# Patient Record
Sex: Male | Born: 1990 | Race: White | Hispanic: No | Marital: Single
Health system: Midwestern US, Community
[De-identification: ages and names within clinical notes are randomized; demographics above are authoritative.]

---
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
## 2014-10-22 ENCOUNTER — Emergency Department (HOSPITAL_COMMUNITY): Payer: Self-pay

## 2014-10-22 ENCOUNTER — Encounter (HOSPITAL_COMMUNITY): Payer: Self-pay | Admitting: Emergency Medicine

## 2014-10-22 ENCOUNTER — Emergency Department (HOSPITAL_COMMUNITY)
Admission: EM | Admit: 2014-10-22 | Discharge: 2014-10-22 | Disposition: A | Discharge status: Home / Self Care | Payer: Self-pay | Attending: Emergency Medicine | Admitting: Emergency Medicine

## 2014-10-22 DIAGNOSIS — Y9289 Other specified places as the place of occurrence of the external cause: Secondary | ICD-10-CM | POA: Insufficient documentation

## 2014-10-22 DIAGNOSIS — L089 Local infection of the skin and subcutaneous tissue, unspecified: Secondary | ICD-10-CM

## 2014-10-22 DIAGNOSIS — L988 Other specified disorders of the skin and subcutaneous tissue: Secondary | ICD-10-CM | POA: Insufficient documentation

## 2014-10-22 DIAGNOSIS — Y9389 Activity, other specified: Secondary | ICD-10-CM | POA: Insufficient documentation

## 2014-10-22 DIAGNOSIS — Z720 Tobacco use: Secondary | ICD-10-CM | POA: Insufficient documentation

## 2014-10-22 DIAGNOSIS — Y998 Other external cause status: Secondary | ICD-10-CM | POA: Insufficient documentation

## 2014-10-22 DIAGNOSIS — X58XXXA Exposure to other specified factors, initial encounter: Secondary | ICD-10-CM | POA: Insufficient documentation

## 2014-10-22 DIAGNOSIS — S6992XA Unspecified injury of left wrist, hand and finger(s), initial encounter: Secondary | ICD-10-CM | POA: Insufficient documentation

## 2014-10-22 MED ORDER — CEPHALEXIN 500 MG PO CAPS: 500.0000 mg | ORAL_CAPSULE | Freq: Three times a day (TID) | ORAL | Status: AC

## 2014-10-22 MED ORDER — LIDOCAINE HCL (PF) 1 % IJ SOLN
10.0000 mL | Freq: Once | INTRAMUSCULAR | Status: AC
  Administered 2014-10-22: 10 mL via INTRADERMAL

## 2014-10-22 MED ORDER — HYDROCODONE-ACETAMINOPHEN 5-325 MG PO TABS: 1.0000 | ORAL_TABLET | Freq: Four times a day (QID) | ORAL | Status: AC | PRN

## 2014-10-22 NOTE — ED Provider Notes (Signed)
CSN: [id number]     Arrival date & time 10/22/14  1208 History  This chart was scribed for Arthor CaptainAbigail Sallie Staron, PA-C working with No att. providers found by Elveria Risingimelie Horne, ED Scribe. This patient was seen in room WTR8/WTR8 and the patient's care was started at 2:33 PM.   Chief Complaint  Patient presents with  . Finger Injury   HPI HPI Comments: Jerry Harvey is a 24 y.o. male who presents to the Emergency Department complaining of worsening left second finger swelling, onset five days ago. Patient is uncertain of causation. Patient reports that two ago he developed swelling and pressure located at the distal tip of the finger, at hyponychium. Patient is unsure of foreign body.  Patient denies illicit drug use.    History reviewed. No pertinent past medical history. History reviewed. No pertinent past surgical history. No family history on file. History  Substance Use Topics  . Smoking status: Current Every Day Smoker -- 1.00 packs/day    Types: Cigarettes  . Smokeless tobacco: Not on file  . Alcohol Use: Yes    Review of Systems  Constitutional: Negative for fever and chills.  Skin: Positive for color change.       Nail infection      Allergies  Review of patient's allergies indicates no known allergies.  Home Medications   Prior to Admission medications   Medication Sig Start Date End Date Taking? Authorizing Provider  acetaminophen (TYLENOL) 325 MG tablet Take 650 mg by mouth every 6 (six) hours as needed for headache.   Yes Historical Provider, MD  cephALEXin (KEFLEX) 500 MG capsule Take 1 capsule (500 mg total) by mouth 3 (three) times daily. 10/22/14   Arthor CaptainAbigail Junie Engram, PA-C  HYDROcodone-acetaminophen (NORCO) 5-325 MG per tablet Take 1-2 tablets by mouth every 6 (six) hours as needed for moderate pain. 10/22/14   Arthor CaptainAbigail Wanisha Shiroma, PA-C   Triage Vitals: BP 158/108 mmHg  Pulse 80  Temp(Src) 98.2 F (36.8 C) (Oral)  Resp 18  SpO2 99% Physical Exam  Constitutional: He is  oriented to person, place, and time. He appears well-developed and well-nourished. No distress.  HENT:  Head: Normocephalic and atraumatic.  Eyes: EOM are normal.  Neck: Neck supple. No tracheal deviation present.  Cardiovascular: Normal rate.   Pulmonary/Chest: Effort normal. No respiratory distress.  Musculoskeletal: Normal range of motion.  Neurological: He is alert and oriented to person, place, and time.  Skin: Skin is warm and dry.  Left index finger: distal finger tip infection   Psychiatric: He has a normal mood and affect. His behavior is normal.  Nursing note and vitals reviewed.   ED Course  INCISION AND DRAINAGE Date/Time: 10/26/2014 1:03 PM Performed by: Arthor CaptainHARRIS, Garnette Greb Authorized by: Arthor CaptainHARRIS, Briannie Gutierrez Consent: Verbal consent obtained. Risks and benefits: risks, benefits and alternatives were discussed Type: abscess Body area: upper extremity Location details: left index finger Anesthesia: digital block Local anesthetic: lidocaine 1% without epinephrine Anesthetic total: 8 ml Scalpel size: 11 Needle gauge: 25. Incision type: elliptical Complexity: simple Drainage: purulent Drainage amount: moderate Wound treatment: wound left open Packing material: none    Site anesthetized, incision made over site, wound drained and explored loculations, rinsed with copious amounts of normal saline, wound packed with sterile gauze, covered with dry, sterile dressing.  Pt tolerated procedure well without complications.  Instructions for care discussed verbally and pt provided with additional written instructions for homecare and f/u.   COORDINATION OF CARE: 2:33 PM- Plans to incise and drain. Discussed treatment plan  with patient at bedside and patient agreed to plan.   Labs Review Labs Reviewed - No data to display  Imaging Review No results found.   EKG Interpretation None      MDM   Final diagnoses:  Finger infection    patient with finger infectionof  the l INDEX EMINATING from the hyponichium. Appears to be from a small splinter which was removed. He will be discharged with abx and hand follow up.   I personally performed the services described in this documentation, which was scribed in my presence. The recorded information has been reviewed and is accurate.      Arthor Captain, PA-C 10/26/14 1309  Derwood Kaplan, MD 10/28/14 1209

## 2014-10-22 NOTE — ED Notes (Addendum)
Pt states on Sunday left index finger began to swell and now he cannot bend finger, denies injury. Pt states the tip of finger is numb now.

## 2014-10-22 NOTE — Discharge Instructions (Signed)
Fingertip Infection °When an infection is around the nail, it is called a paronychia. When it appears over the tip of the finger, it is called a felon. These infections are due to minor injuries or cracks in the skin. If they are not treated properly, they can lead to bone infection and permanent damage to the fingernail. °Incision and drainage is necessary if a pus pocket (an abscess) has formed. Antibiotics and pain medicine may also be needed. Keep your hand elevated for the next 2-3 days to reduce swelling and pain. If a pack was placed in the abscess, it should be removed in 1-2 days by your caregiver. Soak the finger in warm water for 20 minutes 4 times daily to help promote drainage. °Keep the hands as dry as possible. Wear protective gloves with cotton liners. See your caregiver for follow-up care as recommended.  °HOME CARE INSTRUCTIONS  °· Keep wound clean, dry and dressed as suggested by your caregiver. °· Soak in warm salt water for fifteen minutes, four times per day for bacterial infections. °· Your caregiver will prescribe an antibiotic if a bacterial infection is suspected. Take antibiotics as directed and finish the prescription, even if the problem appears to be improving before the medicine is gone. °· Only take over-the-counter or prescription medicines for pain, discomfort, or fever as directed by your caregiver. °SEEK IMMEDIATE MEDICAL CARE IF: °· There is redness, swelling, or increasing pain in the wound. °· Pus or any other unusual drainage is coming from the wound. °· An unexplained oral temperature above 102° F (38.9° C) develops. °· You notice a foul smell coming from the wound or dressing. °MAKE SURE YOU:  °· Understand these instructions. °· Monitor your condition. °· Contact your caregiver if you are getting worse or not improving. °Document Released: 09/06/2004 Document Revised: 10/22/2011 Document Reviewed: 09/02/2008 °Exit[employer]® Patient Information ©2015 [employer]. This  information is not intended to replace advice given to you by your health care provider. Make sure you discuss any questions you have with your health care provider. ° °

## 2019-11-05 ENCOUNTER — Ambulatory Visit: Payer: Self-pay

## 2019-11-05 ENCOUNTER — Ambulatory Visit: Payer: Self-pay | Attending: Internal Medicine

## 2019-11-05 DIAGNOSIS — Z23 Encounter for immunization: Secondary | ICD-10-CM

## 2019-11-05 NOTE — Progress Notes (Signed)
   Covid-19 Vaccination Clinic  Name:  Jerry Harvey    MRN: [id number] DOB: Feb 23, 1991  11/05/2019  Mr. Jerry Harvey was observed post Covid-19 immunization for 15 minutes without incident. He was provided with Vaccine Information Sheet and instruction to access the V-Safe system.   Mr. Jerry Harvey was instructed to call 911 with any severe reactions post vaccine: Marland Kitchen Difficulty breathing  . Swelling of face and throat  . A fast heartbeat  . A bad rash all over body  . Dizziness and weakness   Immunizations Administered    Name Date Dose VIS Date Route   Moderna COVID-19 Vaccine 11/05/2019 11:37 AM 0.5 mL 07/14/2019 Intramuscular   Manufacturer: Moderna   Lot: [id number]   NDC: 24235-361-44

## 2019-12-03 ENCOUNTER — Ambulatory Visit: Payer: Self-pay | Attending: Internal Medicine

## 2019-12-03 DIAGNOSIS — Z23 Encounter for immunization: Secondary | ICD-10-CM

## 2019-12-03 NOTE — Progress Notes (Signed)
   Covid-19 Vaccination Clinic  Name:  Dhilan Brauer    MRN: [id number] DOB: 1990-12-30  12/03/2019  Mr. Mcpheeters was observed post Covid-19 immunization for 15 minutes without incident. He was provided with Vaccine Information Sheet and instruction to access the V-Safe system.   Mr. Creswell was instructed to call 911 with any severe reactions post vaccine: Marland Kitchen Difficulty breathing  . Swelling of face and throat  . A fast heartbeat  . A bad rash all over body  . Dizziness and weakness   Immunizations Administered    Name Date Dose VIS Date Route   Moderna COVID-19 Vaccine 12/03/2019 11:43 AM 0.5 mL 07/2019 Intramuscular   Manufacturer: Gala Murdoch   Lot: [id number]   NDC: 55217-471-59

## 2021-10-16 ENCOUNTER — Inpatient Hospital Stay: Admit: 2021-10-16 | Discharge: 2021-10-16 | Disposition: A | Discharge status: Home / Self Care

## 2021-10-16 DIAGNOSIS — H1031 Unspecified acute conjunctivitis, right eye: Secondary | ICD-10-CM

## 2021-10-16 MED ORDER — POLYMYXIN B-TRIMETHOPRIM 10000-0.1 UNIT/ML-% OP SOLN
OPHTHALMIC | 0 refills | Status: AC
Start: 2021-10-16 — End: 2021-10-23

## 2021-10-16 NOTE — ED Provider Notes (Signed)
[facility]  Urgent Care Encounter       CHIEF COMPLAINT       Chief Complaint   Patient presents with    Conjunctivitis       Nurses Notes reviewed and I agree except as noted in the HPI.  HISTORY OF PRESENT ILLNESS   Shawn Braun is a 31 y.o. male who presents for evaluation of right eye redness and drainage.  Patient states that the symptoms began yesterday but significantly worsened this morning when he woke up as there was a large amount of green discharge in his eye and his eye was significantly more red.  He denies any visual disturbances or any other symptoms at this time.  He does wear glasses but states that he does not wear contacts.    The history is provided by the patient.     REVIEW OF SYSTEMS     Review of Systems   Constitutional:  Negative for chills and fever.   HENT:  Negative for congestion and sore throat.    Eyes:  Positive for discharge and redness. Negative for visual disturbance.   Respiratory:  Negative for cough and shortness of breath.    Cardiovascular:  Negative for chest pain.   Gastrointestinal:  Negative for nausea and vomiting.   Genitourinary:  Negative for difficulty urinating.   Musculoskeletal:  Negative for arthralgias and myalgias.   Skin:  Negative for rash.   Allergic/Immunologic: Negative for immunocompromised state.   Neurological:  Negative for headaches.     PAST MEDICAL HISTORY   History reviewed. No pertinent past medical history.    SURGICALHISTORY     Patient  has no past surgical history on file.    CURRENT MEDICATIONS       Previous Medications    No medications on file       ALLERGIES     Patient is has No Known Allergies.    Patients   There is no immunization history on file for this patient.    FAMILY HISTORY     Patient's family history is not on file.    SOCIAL HISTORY     Patient      PHYSICAL EXAM     ED TRIAGE VITALS  BP: 138/87, Temp: 97.8 ??F (36.6 ??C), Heart Rate: 76, Resp: 16, SpO2: 97 %,Estimated body mass index is 29.21  kg/m?? as calculated from the following:    Height as of this encounter: 6\' 4"  (1.93 m).    Weight as of this encounter: 240 lb (108.9 kg).,No LMP for male patient.    Physical Exam  Vitals and nursing note reviewed.   Eyes:      General:         Right eye: No discharge.         Left eye: No discharge.      Extraocular Movements: Extraocular movements intact.      Conjunctiva/sclera:      Right eye: Right conjunctiva is injected. No exudate.     Left eye: Left conjunctiva is not injected. No exudate.     Pupils: Pupils are equal, round, and reactive to light.       DIAGNOSTIC RESULTS     Labs:No results found for this visit on 10/16/21.    IMAGING:    No orders to display         EKG:      URGENT CARE COURSE:     Vitals:  10/16/21 1001   BP: 138/87   Pulse: 76   Resp: 16   Temp: 97.8 ??F (36.6 ??C)   SpO2: 97%   Weight: 240 lb (108.9 kg)   Height: 6\' 4"  (1.93 m)       Medications - No data to display         PROCEDURES:  None    FINAL IMPRESSION      1. Acute bacterial conjunctivitis of right eye          DISPOSITION/ PLAN       Physical exam is consistent with conjunctivitis of the right eye.  I discussed with the patient the plan to treat with topical antibiotic drops and follow-up on an outpatient basis if symptoms do not improve or worsen.  He is agreeable to the plan as discussed.    PATIENT REFERRED TO:  No primary care provider on file.  No primary physician on file.      DISCHARGE MEDICATIONS:  New Prescriptions    TRIMETHOPRIM-POLYMYXIN B (POLYTRIM) 10000-0.1 UNIT/ML-% OPHTHALMIC SOLUTION    Place 1 drop into the right eye every 4 hours for 7 days       Discontinued Medications    No medications on file       Current Discharge Medication List          , APRN - CNP    (Please note that portions of this note were completed with a voice recognition program. Efforts were made to edit the dictations but occasionally words are mis-transcribed.)           Cora Collum, APRN - CNP  10/16/21  1018

## 2021-11-10 ENCOUNTER — Inpatient Hospital Stay: Admit: 2021-11-10 | Discharge: 2021-11-10 | Disposition: A | Discharge status: Home / Self Care

## 2021-11-10 DIAGNOSIS — S61310A Laceration without foreign body of right index finger with damage to nail, initial encounter: Secondary | ICD-10-CM

## 2021-11-10 DIAGNOSIS — S61210A Laceration without foreign body of right index finger without damage to nail, initial encounter: Secondary | ICD-10-CM

## 2021-11-10 MED ORDER — BACITRACIN ZINC 500 UNIT/GM EX OINT
500 UNIT/GM | Freq: Once | CUTANEOUS | Status: AC
Start: 2021-11-10 — End: 2021-11-10
  Administered 2021-11-10: 13:00:00 1 via TOPICAL

## 2021-11-10 MED ORDER — IBUPROFEN 800 MG PO TABS
800 MG | ORAL_TABLET | Freq: Three times a day (TID) | ORAL | 0 refills | Status: AC
Start: 2021-11-10 — End: ?

## 2021-11-10 MED ORDER — CEPHALEXIN 500 MG PO CAPS
500 MG | ORAL_CAPSULE | Freq: Three times a day (TID) | ORAL | 0 refills | Status: AC
Start: 2021-11-10 — End: 2021-11-17

## 2021-11-10 MED ORDER — TETANUS-DIPHTH-ACELL PERTUSSIS 5-2.5-18.5 LF-MCG/0.5 IM SUSP
Freq: Once | INTRAMUSCULAR | Status: AC
Start: 2021-11-10 — End: 2021-11-10
  Administered 2021-11-10: 13:00:00 0.5 mL via INTRAMUSCULAR

## 2021-11-10 MED FILL — BACITRACIN ZINC 500 UNIT/GM EX OINT: 500 UNIT/GM | CUTANEOUS | Qty: 1

## 2021-11-10 MED FILL — BOOSTRIX 5-2.5-18.5 LF-MCG/0.5 IM SUSP: INTRAMUSCULAR | Qty: 0.5

## 2021-11-10 NOTE — ED Provider Notes (Signed)
[facility]  Urgent Care Encounter       CHIEF COMPLAINT       Chief Complaint   Patient presents with    Finger Laceration     Right index finger        Nurses Notes reviewed and I agree except as noted in the HPI.  HISTORY OF PRESENT ILLNESS   Shawn Braun is a 31 y.o. male who presents for evaluation of a laceration to the right index finger.  Patient states that he was working on laying some new carpet in his home last night around 9:30 PM when the knife he was using slipped and cut across his index finger.  He states that there is pain but denies any numbness or tingling.  He applied some bandages at home but denies any other medications or interventions.  He states that his last tetanus shot has been well over 10 years ago.    The history is provided by the patient.     REVIEW OF SYSTEMS     Review of Systems   Constitutional:  Negative for chills and fever.   Respiratory:  Negative for shortness of breath.    Cardiovascular:  Negative for chest pain.   Gastrointestinal:  Negative for nausea and vomiting.   Musculoskeletal:  Positive for arthralgias.   Skin:  Positive for wound. Negative for rash.   Neurological:  Negative for weakness and numbness.     PAST MEDICAL HISTORY   History reviewed. No pertinent past medical history.    SURGICALHISTORY     Patient  has no past surgical history on file.    CURRENT MEDICATIONS       Previous Medications    No medications on file       ALLERGIES     Patient is has No Known Allergies.    Patients   Immunization History   Administered Date(s) Administered    TDaP, ADACEL (age 64y-64y), Leda Min (age 10y+), IM, 0.73mL 11/10/2021       FAMILY HISTORY     Patient's family history is not on file.    SOCIAL HISTORY     Patient  reports that he has been smoking e-cigarettes. He has never used smokeless tobacco. He reports that he does not currently use alcohol. He reports that he does not use drugs.    PHYSICAL EXAM     ED TRIAGE VITALS  BP:  129/80, Temp: 98.1 F (36.7 C), Heart Rate: 76, Resp: 16, SpO2: 99 %,Estimated body mass index is 28 kg/m as calculated from the following:    Height as of this encounter: 6\' 4"  (1.93 m).    Weight as of this encounter: 230 lb (104.3 kg).,No LMP for male patient.    Physical Exam  Vitals and nursing note reviewed.   Constitutional:       General: He is not in acute distress.     Appearance: He is well-developed. He is not diaphoretic.   Eyes:      Conjunctiva/sclera:      Right eye: Right conjunctiva is not injected.      Left eye: Left conjunctiva is not injected.      Pupils: Pupils are equal.   Cardiovascular:      Rate and Rhythm: Normal rate and regular rhythm.      Heart sounds: No murmur heard.  Pulmonary:      Effort: Pulmonary effort is normal. No respiratory distress.  Breath sounds: Normal breath sounds.   Musculoskeletal:      Right hand: Laceration and tenderness present. No swelling. Normal range of motion. Normal strength. Normal sensation. Normal capillary refill.        Hands:       Cervical back: Normal range of motion.      Comments: 1 cm laceration to the distal surface of the right index finger extending through the nail   Skin:     General: Skin is warm.      Findings: No rash.   Neurological:      Mental Status: He is alert and oriented to person, place, and time.   Psychiatric:         Behavior: Behavior normal.       DIAGNOSTIC RESULTS     Labs:No results found for this visit on 11/10/21.    IMAGING:    No orders to display         EKG:      URGENT CARE COURSE:     Vitals:    11/10/21 0823   BP: 129/80   Pulse: 76   Resp: 16   Temp: 98.1 F (36.7 C)   TempSrc: Temporal   SpO2: 99%   Weight: 230 lb (104.3 kg)   Height: 6\' 4"  (1.93 m)       Medications   Tetanus-Diphth-Acell Pertussis (BOOSTRIX) injection 0.5 mL (0.5 mLs IntraMUSCular Given 11/10/21 0842)   bacitracin zinc ointment 1 Package (1 Package Topical Given 11/10/21 0844)            PROCEDURES:  None    FINAL IMPRESSION      1.  Laceration of right index finger without foreign body with damage to nail, initial encounter          DISPOSITION/ PLAN     I discussed with the patient that based on the location of the laceration I do not believe this is a good candidate for suturing and we will apply topical antibiotic ointment and apply a dressing as well as oral antibiotics to prevent infection.  Patient is advised to keep the wound clean and dry as much as possible and to follow-up if symptoms would progress.  He is agreeable to plan as discussed.      PATIENT REFERRED TO:  No primary care provider on file.  No primary physician on file.      DISCHARGE MEDICATIONS:  New Prescriptions    CEPHALEXIN (KEFLEX) 500 MG CAPSULE    Take 1 capsule by mouth 3 times daily for 7 days    IBUPROFEN (ADVIL;MOTRIN) 800 MG TABLET    Take 1 tablet by mouth 3 times daily (with meals)       Discontinued Medications    No medications on file       Current Discharge Medication List          11/12/21, APRN - CNP    (Please note that portions of this note were completed with a voice recognition program. Efforts were made to edit the dictations but occasionally words are mis-transcribed.)          Cora Collum, APRN - CNP  11/10/21 [phone]

## 2021-11-10 NOTE — ED Notes (Signed)
Right index finger cleaned with antimicrobial wound cleanser.  Dried.  Bacitracin applied, nonstick gauze, wrapped with coban.  Covered the right index finger with an aluminium finger splint.  Secured with coban.  Tolerated well.  Tetanus updated.       Arline Asp, RN  11/10/21 [phone]

## 2021-11-10 NOTE — ED Triage Notes (Signed)
Arrives to [facility] for the evaluation of right index finger laceration that occurred last night around 2130.  Cut the finger with a box knife while replacing carpet at his house.  After cutting the finger patient washed the site with peroxide and alcohol.  Is covered with at bandaid at this time.  Patient rates pain 9/10 in severity.  Did take Tylenol this morning but not helping.  Last Tetanus was in 2007.  Afebrile.  Waiting provider to assess.

## 2022-09-22 ENCOUNTER — Inpatient Hospital Stay
Admit: 2022-09-22 | Discharge: 2022-09-22 | Disposition: A | Discharge status: Home / Self Care | Payer: PRIVATE HEALTH INSURANCE

## 2022-09-22 DIAGNOSIS — R059 Cough, unspecified: Secondary | ICD-10-CM

## 2022-09-22 DIAGNOSIS — R058 Other specified cough: Secondary | ICD-10-CM

## 2022-09-22 MED ORDER — PREDNISONE 20 MG PO TABS
20 | ORAL_TABLET | Freq: Two times a day (BID) | ORAL | 0 refills | Status: AC
Start: 2022-09-22 — End: 2022-09-27

## 2022-09-22 MED ORDER — PROMETHAZINE-DM 6.25-15 MG/5ML PO SYRP
Freq: Four times a day (QID) | ORAL | 0 refills | Status: AC | PRN
Start: 2022-09-22 — End: 2022-09-29

## 2022-09-22 MED ORDER — BENZONATATE 200 MG PO CAPS
200 | ORAL_CAPSULE | Freq: Three times a day (TID) | ORAL | 0 refills | Status: AC | PRN
Start: 2022-09-22 — End: 2022-09-29

## 2022-09-22 NOTE — ED Triage Notes (Signed)
Pt to UC with c/o cough ongoing x 1 week

## 2022-09-22 NOTE — ED Provider Notes (Signed)
[facility]  Urgent Care Encounter      CHIEF COMPLAINT       Chief Complaint   Patient presents with    Cough       Nurses Notes reviewed and I agree except as noted in the HPI.  HISTORY OF PRESENT ILLNESS   Shawn Braun is a 32 y.o. male who presents to urgent care with complaints of cough.  Patient reports approximately 2 weeks ago he had a fever with congestion and cough.  Patient reports that resolved as of the cough has not.  Patient denies shortness of breath, chest pain, abdominal pain.  Patient reports he has been using over-the-counter remedies without relief.  Patient denies being around anyone sick recently that he is aware of.    REVIEW OF SYSTEMS     Review of Systems   Constitutional:  Negative for fatigue and fever.   HENT:  Negative for congestion, postnasal drip, rhinorrhea, sinus pain and sore throat.    Respiratory:  Positive for cough. Negative for shortness of breath and wheezing.    Cardiovascular:  Negative for chest pain.   Gastrointestinal:  Negative for abdominal pain, diarrhea, nausea and vomiting.   Musculoskeletal:  Negative for arthralgias.   Neurological:  Negative for dizziness, seizures, numbness and headaches.       PAST MEDICAL HISTORY   History reviewed. No pertinent past medical history.    SURGICAL HISTORY     Patient  has no past surgical history on file.    CURRENT MEDICATIONS       Discharge Medication List as of 09/22/2022  8:46 AM        CONTINUE these medications which have NOT CHANGED    Details   ibuprofen (ADVIL;MOTRIN) 800 MG tablet Take 1 tablet by mouth 3 times daily (with meals), Disp-90 tablet, R-0Normal             ALLERGIES     Patient is has No Known Allergies.    FAMILY HISTORY     Patient'sfamily history is not on file.    SOCIAL HISTORY     Patient  reports that he has been smoking e-cigarettes. He has never used smokeless tobacco. He reports that he does not currently use alcohol. He reports that he does not use  drugs.    PHYSICAL EXAM     ED TRIAGE VITALS  BP: (!) 133/92, Temp: 98.1 F (36.7 C), Pulse: 91, Respirations: 20, SpO2: 96 %  Physical Exam  Vitals and nursing note reviewed.   Constitutional:       Appearance: Normal appearance.   HENT:      Head: Normocephalic and atraumatic.      Nose: Nose normal.   Eyes:      Pupils: Pupils are equal, round, and reactive to light.   Cardiovascular:      Rate and Rhythm: Normal rate and regular rhythm.   Pulmonary:      Effort: Pulmonary effort is normal. No respiratory distress.      Breath sounds: Normal breath sounds. No stridor. No wheezing or rhonchi.   Abdominal:      General: Abdomen is flat.      Palpations: Abdomen is soft.      Tenderness: There is no abdominal tenderness.   Skin:     General: Skin is warm and dry.   Neurological:      Mental Status: He is alert and oriented to person, place, and time.  DIAGNOSTIC RESULTS   Labs:No results found for this visit on 09/22/22.    IMAGING:  No orders to display      URGENT CARE COURSE:     Vitals:    09/22/22 0831   BP: (!) 133/92   Pulse: 91   Resp: 20   Temp: 98.1 F (36.7 C)   SpO2: 96%       Medications - No data to display  PROCEDURES:  None  FINAL IMPRESSION      1. Post-viral cough syndrome        DISPOSITION/PLAN   DISPOSITION Decision To Discharge 09/22/2022 08:45:29 AM    Physical exam consistent with postviral cough.  Discussed with patient lung sounds are clear and I do not have any concern for pneumonia at this time.  Discussed with patient plan to treat with oral steroids as well as cough suppressants.  Discussed with patient if symptoms persist and do not improve over the next 5 days to follow-up with his primary care provider.  Discussed with patient if you develop shortness of breath, chest pain, high fevers to go to the nearest emergency room.  Encourage hydration.  Patient in agreement with this plan.    PATIENT REFERRED TO:  [place]  [place] [id number]  [phone]  Schedule an appointment as soon as possible for a visit   As needed    DISCHARGE MEDICATIONS:  Discharge Medication List as of 09/22/2022  8:46 AM        START taking these medications    Details   predniSONE (DELTASONE) 20 MG tablet Take 1 tablet by mouth 2 times daily for 5 days, Disp-10 tablet, R-0Normal      benzonatate (TESSALON) 200 MG capsule Take 1 capsule by mouth 3 times daily as needed for Cough, Disp-21 capsule, R-0Normal      promethazine-dextromethorphan (PROMETHAZINE-DM) 6.25-15 MG/5ML syrup Take 5 mLs by mouth 4 times daily as needed for Cough, Disp-118 mL, R-0Normal           Discharge Medication List as of 09/22/2022  8:46 AM          Lambert Keto, APRN - CNP            Lambert Keto, APRN - CNP  09/22/22 0848

## 2023-09-27 ENCOUNTER — Inpatient Hospital Stay: Admit: 2023-09-27 | Discharge: 2023-09-27 | Disposition: A | Discharge status: Home / Self Care

## 2023-09-27 DIAGNOSIS — J069 Acute upper respiratory infection, unspecified: Secondary | ICD-10-CM

## 2023-09-27 NOTE — ED Triage Notes (Signed)
 To room with c/o chest congestion x 2 weeks. He is not getting much sleep due to child being ill and relates his symptoms to fatigue.    He almost passed out last night at work and they require him to take 3 days off for this. He needs a note to return to Tuesday.

## 2023-09-27 NOTE — ED Provider Notes (Signed)
 [facility]  Urgent Care Encounter       CHIEF COMPLAINT       Chief Complaint   Patient presents with    Chest Congestion    Fatigue    Cough       Nurses Notes reviewed and I agree except as noted in the HPI.  HISTORY OF PRESENT ILLNESS   Shawn Braun is a 33 y.o. male who presents to the [facility] for evaluation of cough.  Reports that he has been sick for roughly 2 weeks.  Reports that his symptoms are starting to mildly get better.  Reports that he was really tired at work and stumbled. Reports that he is here for a work note to return to work.    The history is provided by the patient. No language interpreter was used.       REVIEW OF SYSTEMS     Review of Systems   Constitutional:  Positive for fatigue. Negative for activity change, appetite change, chills and fever.   HENT:  Positive for congestion, postnasal drip and rhinorrhea. Negative for ear discharge, ear pain and sore throat.    Respiratory:  Positive for cough and chest tightness. Negative for shortness of breath.    Cardiovascular:  Negative for chest pain.   Gastrointestinal:  Negative for diarrhea, nausea and vomiting.   Genitourinary:  Negative for dysuria.   Skin:  Negative for rash.   Allergic/Immunologic: Negative for environmental allergies and food allergies.   Neurological:  Negative for dizziness and headaches.       PAST MEDICAL HISTORY   History reviewed. No pertinent past medical history.    SURGICALHISTORY     Patient  has no past surgical history on file.    CURRENT MEDICATIONS       Discharge Medication List as of 09/27/2023 11:02 AM        CONTINUE these medications which have NOT CHANGED    Details   ibuprofen (ADVIL;MOTRIN) 800 MG tablet Take 1 tablet by mouth 3 times daily (with meals), Disp-90 tablet, R-0Normal             ALLERGIES     Patient is has No Known Allergies.    Patients   Immunization History   Administered Date(s) Administered    TDaP, ADACEL (age 87y-64y), Leda Min (age 10y+), IM,  0.59mL 11/10/2021       FAMILY HISTORY     Patient's family history is not on file.    SOCIAL HISTORY     Patient  reports that he has been smoking e-cigarettes. He has never used smokeless tobacco. He reports that he does not currently use alcohol. He reports that he does not use drugs.    PHYSICAL EXAM     ED TRIAGE VITALS  BP: (!) 146/85, Temp: 98.1 F (36.7 C), Pulse: 61, Respirations: 18, SpO2: 98 %,Estimated body mass index is 28 kg/m as calculated from the following:    Height as of 11/10/21: 1.93 m (6\' 4" ).    Weight as of 11/10/21: 104.3 kg (230 lb).,No LMP for male patient.    Physical Exam  Vitals and nursing note reviewed.   Constitutional:       General: He is not in acute distress.     Appearance: Normal appearance. He is not ill-appearing, toxic-appearing or diaphoretic.   HENT:      Head: Normocephalic.      Right Ear: Ear canal and external ear normal.  Left Ear: Ear canal and external ear normal.      Nose: Congestion present. No rhinorrhea.      Mouth/Throat:      Mouth: Mucous membranes are moist.      Pharynx: Oropharynx is clear. No oropharyngeal exudate or posterior oropharyngeal erythema.   Cardiovascular:      Rate and Rhythm: Normal rate.      Pulses: Normal pulses.   Pulmonary:      Effort: Pulmonary effort is normal. No respiratory distress.      Breath sounds: No stridor. No wheezing or rhonchi.   Abdominal:      General: Abdomen is flat. Bowel sounds are normal.      Palpations: Abdomen is soft.   Musculoskeletal:         General: No swelling or tenderness. Normal range of motion.      Cervical back: Normal range of motion.   Neurological:      General: No focal deficit present.      Mental Status: He is alert and oriented to person, place, and time.   Psychiatric:         Mood and Affect: Mood normal.         Behavior: Behavior normal.         DIAGNOSTIC RESULTS     Labs:No results found for this visit on 09/27/23.    IMAGING:    No orders to display         EKG: None      URGENT  CARE COURSE:     Vitals:    09/27/23 1036   BP: (!) 146/85   Pulse: 61   Resp: 18   Temp: 98.1 F (36.7 C)   SpO2: 98%       Medications - No data to display         PROCEDURES:  None    FINAL IMPRESSION      1. Acute upper respiratory infection          DISPOSITION/ PLAN     Patient seen and evaluated for the above symptoms.  Assessment reveals acute viral URI with cough.  I did discuss with patient that the symptoms are likely viral in nature and that antibiotics would not be effective at this time.  We did discuss that the symptoms are likely benign and self-limiting.  Symptoms may be present for up to 7 to 10 days.  Patient is encouraged to use over-the-counter Zyrtec, Flonase, and Mucinex D.  Can use over-the-counter cough suppressant.  Should have good hand hygiene and cover mouth when coughing.  Instructed use over-the-counter Tylenol and Motrin for pain or fever.  Should follow-up with PCP in 3 to 5 days and worsening symptoms.  Should present to the emergency department if symptoms worsen or other symptoms deemed emergent.  Patient is agreeable with the above plan and denies questions or concerns at this time.      PATIENT REFERRED TO:  No primary care provider on file.  No primary physician on file.      DISCHARGE MEDICATIONS:  Discharge Medication List as of 09/27/2023 11:02 AM          Discharge Medication List as of 09/27/2023 11:02 AM          Discharge Medication List as of 09/27/2023 11:02 AM          Renetta Chalk, APRN - CNP    (Please note that portions of this note were completed with a  voice recognition program. Efforts were made to edit the dictations but occasionally words are mis-transcribed.)           Renetta Chalk, APRN - CNP  09/27/23 1334
